# Patient Record
Sex: Male | Born: 2000 | Race: White | Hispanic: No | Marital: Single
Health system: Southern US, Community
[De-identification: ages and names within clinical notes are randomized; demographics above are authoritative.]

## PROBLEM LIST (undated history)

## (undated) HISTORY — PX: HYDROCELE EXCISION / REPAIR: SUR1145

## (undated) HISTORY — PX: TONSILLECTOMY: SUR1361

---
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
## 2023-11-20 ENCOUNTER — Encounter: Payer: Self-pay | Admitting: Emergency Medicine

## 2023-11-20 ENCOUNTER — Ambulatory Visit
Admission: EM | Admit: 2023-11-20 | Discharge: 2023-11-20 | Disposition: A | Discharge status: Home / Self Care | Attending: Family Medicine | Admitting: Family Medicine

## 2023-11-20 DIAGNOSIS — S0552XA Penetrating wound with foreign body of left eyeball, initial encounter: Secondary | ICD-10-CM

## 2023-11-20 MED ORDER — EYE WASH OP SOLN
1000.0000 [drp] | OPHTHALMIC | Status: DC | PRN
  Administered 2023-11-20: 1000 [drp] via OPHTHALMIC

## 2023-11-20 NOTE — ED Provider Notes (Signed)
 [facility]    CSN: [id number] Arrival date & time: 11/20/23  1025      History   Chief Complaint Chief Complaint  Patient presents with   Foreign Body in Eye    left    HPI HPI  Akaash Vandewater is a 23 y.o. male.    Jabri presents for left eye pain that started Friday afternoon but pain got worse yesterday. Tried  eye drops and rinsing his eye prior to arrival.  He works as a Psychologist, occupational and was cleaning up the shop. He was wearing eye protection. Colter feels like something is in his eye but does not see anything.   Riku does not wear glasses or contacts.  Selim has not had any trouble seeing. Has light sensitivity and watery discharge from the eye.  Chace has otherwise been well and has no additional concerns today.   History reviewed. No pertinent past medical history.  Patient Active Problem List   Diagnosis Date Noted   Dysuria 09/18/2017   Fatigue 09/18/2017    Past Surgical History:  Procedure Laterality Date   HYDROCELE EXCISION / REPAIR     TONSILLECTOMY         Home Medications    Prior to Admission medications   Not on File    Family History History reviewed. No pertinent family history.  Social History Social History   Tobacco Use   Smoking status: Passive Smoke Exposure - Never Smoker   Smokeless tobacco: Never  Vaping Use   Vaping status: Never Used  Substance Use Topics   Alcohol use: Never   Drug use: Never     Allergies   Patient has no known allergies.   Review of Systems Review of Systems : negative unless otherwise stated in HPI.      Physical Exam Triage Vital Signs ED Triage Vitals  Encounter Vitals Group     BP      Girls Systolic BP Percentile      Girls Diastolic BP Percentile      Boys Systolic BP Percentile      Boys Diastolic BP Percentile      Pulse      Resp      Temp      Temp src      SpO2      Weight      Height      Head Circumference      Peak Flow      Pain Score       Pain Loc      Pain Education      Exclude from Growth Chart    No data found.  Updated Vital Signs BP 108/71 (BP Location: Left Arm)   Pulse 73   Temp 98.3 F (36.8 C) (Oral)   Resp 15   Ht 6' (1.829 m)   Wt 70.3 kg   SpO2 99%   BMI 21.02 kg/m   Visual Acuity Right Eye Distance: 20/20 uncorrected Left Eye Distance: 20/20 uncorrected Bilateral Distance: 20/20 uncorrected  Right Eye Near:   Left Eye Near:    Bilateral Near:     Physical Exam  GEN: Uncomfortable appearing male, in no acute distress  NECK: normal ROM  CV: regular rate  RESP: no increased work of breathing EYES:     General: Lids are normal. Lids are everted. Vision grossly intact. Gaze aligned appropriately.        Right eye:  No foreign body, discharge or  hordeolum.     Left eye: +foreign body present at the 3 o'clock position on the cornea overlying the iris, watery discharge present. No hyphema or hordeolum.     Extraocular Movements: Extraocular movements intact.     PERRLA     Conjunctiva/sclera:     Left eye: Left conjunctiva is injected. No chemosis or hemorrhage.    Comments: fluorescein stain deferred as adherent foreign body present   SKIN: warm and dry   UC Treatments / Results  Labs (all labs ordered are listed, but only abnormal results are displayed) Labs Reviewed - No data to display  EKG   Radiology No results found.  Procedures Procedures (including critical care time)  Medications Ordered in UC Medications - No data to display   Initial Impression / Assessment and Plan / UC Course  I have reviewed the triage vital signs and the nursing notes.  Pertinent labs & imaging results that were available during my care of the patient were reviewed by me and considered in my medical decision making (see chart for details).      Vision screen is normal (uncorrected).    Patient is a 23 y.o. male who presents after getting debris in his eye while at work.  He is a  Psychologist, occupational.  He has left eye pain with tearing and redness.  On exam, he has a adherent foreign body that was not able to be removed here at the urgent care with eyewash and manual manipulation.  Discussed with on-call ophthalmologist for [facility] Dr. Jaye who works with the [facility].  Dr. Jaye prefers to see patient outpatient today at his clinic in 30 minutes.  Instructions given to patient.  Will drive directly to the [facility] for further evaluation. Understanding voiced.     Discussed MDM, treatment plan and plan for follow-up with patient  who agrees with plan.     Final Clinical Impressions(s) / UC Diagnoses   Final diagnoses:  Foreign body in lens of left eye, initial encounter     Discharge Instructions      You have a foreign body on your cornea.  I spoke with the ophthalmologist for [facility] and will see you at their [place] office in 30 minutes.  Meet Dr Jaye at the side door on the right hand side which should be near the back of the building.     Address: [address] [place], [place] [address] Phone: [phone]     ED Prescriptions   None    PDMP not reviewed this encounter.   Shaylea Ucci, DO 11/20/23 1734

## 2023-11-20 NOTE — Discharge Instructions (Addendum)
 You have a foreign body on your cornea.  I spoke with the ophthalmologist for [facility] and will see you at their [place] office in 30 minutes.  Meet Dr Jaye at the side door on the right hand side which should be near the back of the building.     Address: [address], [place], [place] [address] Phone: [phone]

## 2023-11-20 NOTE — ED Triage Notes (Signed)
 Patient states that he was cleaning out the shop and was wearing his safety glasses on Friday and thinks he might have gotten something in his left eye.  Patient reports left eye redness, irritation and drainage.
